# Patient Record
Sex: Male | Born: 1975 | Race: White | Hispanic: No | Marital: Married | Smoking: Never smoker
Health system: Southern US, Community
[De-identification: ages and names within clinical notes are randomized; demographics above are authoritative.]

## PROBLEM LIST (undated history)

## (undated) DIAGNOSIS — K219 Gastro-esophageal reflux disease without esophagitis: Secondary | ICD-10-CM

---

## 2011-08-03 ENCOUNTER — Emergency Department (INDEPENDENT_AMBULATORY_CARE_PROVIDER_SITE_OTHER)
Admission: EM | Admit: 2011-08-03 | Discharge: 2011-08-03 | Disposition: A | Discharge status: Home / Self Care | Payer: 59 | Source: Home / Self Care | Attending: Family Medicine | Admitting: Family Medicine

## 2011-08-03 DIAGNOSIS — S239XXA Sprain of unspecified parts of thorax, initial encounter: Secondary | ICD-10-CM

## 2011-08-03 DIAGNOSIS — S29012A Strain of muscle and tendon of back wall of thorax, initial encounter: Secondary | ICD-10-CM

## 2011-08-03 HISTORY — DX: Gastro-esophageal reflux disease without esophagitis: K21.9

## 2011-08-03 MED ORDER — CYCLOBENZAPRINE HCL 10 MG PO TABS: ORAL_TABLET | ORAL | Status: DC

## 2011-08-03 NOTE — ED Notes (Signed)
Right shoulder pain started yesterday w/out injury.

## 2011-08-03 NOTE — Discharge Instructions (Signed)
Continue Ibuprofen 800mg  every 8 hours with food.  Begin exercises as per instruction sheet.

## 2011-08-03 NOTE — ED Provider Notes (Signed)
History     CSN: [id number]  Arrival date & time 08/03/11  1541   First MD Initiated Contact with Patient 08/03/11 1634      Chief Complaint  Patient presents with  . Shoulder Pain     HPI Comments: Patient complains of awakening yesterday morning with pain in his right shoulder and upper back area.  He now has mild pain with movement of his right shoulder and shoulder blade, and pain with cough and deep inspiration although he does not have a cough.  He recalls no injury or recent change in physical activities.  The pain has improved after taking Ibuprofen 800mg   Patient is a 36 y.o. male presenting with shoulder pain. The history is provided by the patient.  Shoulder Pain This is a new problem. The current episode started yesterday. The problem occurs constantly. The problem has not changed since onset.Pertinent negatives include no chest pain, no abdominal pain, no headaches and no shortness of breath. The symptoms are aggravated by coughing (shoulder movement). The symptoms are relieved by NSAIDs. He has tried a warm compress for the symptoms. The treatment provided mild relief.    Past Medical History  Diagnosis Date  . GERD (gastroesophageal reflux disease)     History reviewed. No pertinent past surgical history.  Family History  Problem Relation Age of Onset  . Cancer Father   . Diabetes Father     History  Substance Use Topics  . Smoking status: Never Smoker   . Smokeless tobacco: Not on file  . Alcohol Use: No      Review of Systems  Respiratory: Negative for shortness of breath.   Cardiovascular: Negative for chest pain.  Gastrointestinal: Negative for abdominal pain.  Neurological: Negative for headaches.  All other systems reviewed and are negative.    Allergies  Amoxicillin; Excedrin extra strength; Naproxen; Penicillins; and Tylenol  Home Medications   Current Outpatient Rx  Name Route Sig Dispense Refill  . ESOMEPRAZOLE MAGNESIUM 20 MG PO CPDR  Oral Take 20 mg by mouth daily before breakfast.    . CYCLOBENZAPRINE HCL 10 MG PO TABS  Take one tab by mouth at bedtime as needed. 20 tablet 1    BP 138/94  Pulse 86  Temp(Src) 98.9 F (37.2 C) (Oral)  Resp 20  Ht 6\' 2"  (1.88 m)  Wt 251 lb 8 oz (114.08 kg)  BMI 32.29 kg/m2  SpO2 98%  Physical Exam  Nursing note and vitals reviewed. Constitutional: He is oriented to person, place, and time. He appears well-developed and well-nourished. No distress.  HENT:  Head: Normocephalic.  Eyes: Conjunctivae are normal. Pupils are equal, round, and reactive to light.  Neck: Normal range of motion. Neck supple.  Cardiovascular: Normal rate, regular rhythm and normal heart sounds.   Pulmonary/Chest: Effort normal and breath sounds normal. No respiratory distress. He has no wheezes. He has no rales. He exhibits no tenderness.  Abdominal: Soft. There is no tenderness.  Musculoskeletal:       Right shoulder: He exhibits normal range of motion, no tenderness, no bony tenderness, no swelling, no effusion, no crepitus, no deformity, no pain, no spasm, normal pulse and normal strength.       Arms:      Right shoulder has full range of motion. There is mild tenderness over the right trapezius muscle. There is distinct tenderness over medial and inferior edges of right scapula.  Pain elicited by resisted abduction of right shoulder while palpating right rhomboid muscles.  Distal Neurovascular function is intact.   Lymphadenopathy:    He has no cervical adenopathy.  Neurological: He is alert and oriented to person, place, and time.  Skin: Skin is warm and dry.    ED Course  Procedures none      1. Rhomboid muscle strain       MDM   Begin Flexeril 10mg  at bedtime. Continue Ibuprofen 800mg  every 8 hours with food.  Begin exercises as per instruction sheet ([facility] information and instruction handout given)  Followup with [facility] if not improving about two weeks.          Lattie Haw, MD 08/03/11 1714

## 2013-08-27 ENCOUNTER — Ambulatory Visit (INDEPENDENT_AMBULATORY_CARE_PROVIDER_SITE_OTHER): Payer: 59 | Admitting: Sports Medicine

## 2013-08-27 ENCOUNTER — Encounter: Payer: Self-pay | Admitting: Sports Medicine

## 2013-08-27 ENCOUNTER — Ambulatory Visit (INDEPENDENT_AMBULATORY_CARE_PROVIDER_SITE_OTHER): Payer: 59

## 2013-08-27 VITALS — BP 146/94 | HR 85 | Ht 74.0 in | Wt 255.0 lb

## 2013-08-27 DIAGNOSIS — M5412 Radiculopathy, cervical region: Secondary | ICD-10-CM | POA: Insufficient documentation

## 2013-08-27 DIAGNOSIS — M5380 Other specified dorsopathies, site unspecified: Secondary | ICD-10-CM

## 2013-08-27 MED ORDER — MELOXICAM 15 MG PO TABS: ORAL_TABLET | ORAL | Status: DC

## 2013-08-27 MED ORDER — PREDNISONE 50 MG PO TABS: ORAL_TABLET | ORAL | Status: DC

## 2013-08-27 NOTE — Progress Notes (Signed)
   Subjective:    I'm seeing this patient as a consultation for:  Dr. Cathren HarshBeese  CC: Left shoulder and neck pain  HPI: For the past 5 years this pleasant 38 year old male has had pain the localized from the neck radiating over the top of the shoulder, causing a tightness and burning type pain between the shoulder blades. Pain is moderate, persistent. It does seem better when he elevates his arms.  Past medical history, Surgical history, Family history not pertinant except as noted below, Social history, Allergies, and medications have been entered into the medical record, reviewed, and no changes needed.   Review of Systems: No headache, visual changes, nausea, vomiting, diarrhea, constipation, dizziness, abdominal pain, skin rash, fevers, chills, night sweats, weight loss, swollen lymph nodes, body aches, joint swelling, muscle aches, chest pain, shortness of breath, mood changes, visual or auditory hallucinations.   Objective:   General: Well Developed, well nourished, and in no acute distress.  Neuro/Psych: Alert and oriented x3, extra-ocular muscles intact, able to move all 4 extremities, sensation grossly intact. Skin: Warm and dry, no rashes noted.  Respiratory: Not using accessory muscles, speaking in full sentences, trachea midline.  Cardiovascular: Pulses palpable, no extremity edema. Abdomen: Does not appear distended. Neck: Inspection unremarkable. No palpable stepoffs. Negative Spurling's maneuver. Full neck range of motion Grip strength and sensation normal in bilateral hands Strength good C4 to T1 distribution No sensory change to C4 to T1 Negative Hoffman sign bilaterally Reflexes normal  Impression and Recommendations:   This case required medical decision making of moderate complexity.

## 2013-08-27 NOTE — Assessment & Plan Note (Signed)
With left-sided upper shoulder and dorsal scapular symptoms. X-rays, PT, prednisone, Mobic. Return to see me one month, MRI of the bladder.

## 2013-09-10 ENCOUNTER — Ambulatory Visit: Payer: 59 | Admitting: Physical Therapy

## 2013-09-13 ENCOUNTER — Encounter: Payer: 59 | Admitting: Physical Therapy

## 2013-09-15 ENCOUNTER — Encounter: Payer: 59 | Admitting: Physical Therapy

## 2013-09-15 ENCOUNTER — Ambulatory Visit (INDEPENDENT_AMBULATORY_CARE_PROVIDER_SITE_OTHER): Payer: 59 | Admitting: Physical Therapy

## 2013-09-15 DIAGNOSIS — M25519 Pain in unspecified shoulder: Secondary | ICD-10-CM

## 2013-09-15 DIAGNOSIS — M542 Cervicalgia: Secondary | ICD-10-CM

## 2013-09-15 DIAGNOSIS — M5412 Radiculopathy, cervical region: Secondary | ICD-10-CM

## 2013-09-15 DIAGNOSIS — M6281 Muscle weakness (generalized): Secondary | ICD-10-CM

## 2013-09-22 ENCOUNTER — Encounter: Payer: 59 | Admitting: Physical Therapy

## 2013-09-24 ENCOUNTER — Encounter: Payer: 59 | Admitting: Physical Therapy

## 2013-09-29 ENCOUNTER — Encounter (INDEPENDENT_AMBULATORY_CARE_PROVIDER_SITE_OTHER): Payer: 59 | Admitting: Physical Therapy

## 2013-09-29 DIAGNOSIS — M6281 Muscle weakness (generalized): Secondary | ICD-10-CM | POA: Diagnosis not present

## 2013-09-29 DIAGNOSIS — M5412 Radiculopathy, cervical region: Secondary | ICD-10-CM | POA: Diagnosis not present

## 2013-09-29 DIAGNOSIS — M25519 Pain in unspecified shoulder: Secondary | ICD-10-CM | POA: Diagnosis not present

## 2013-09-29 DIAGNOSIS — M542 Cervicalgia: Secondary | ICD-10-CM | POA: Diagnosis not present

## 2013-10-01 ENCOUNTER — Ambulatory Visit (INDEPENDENT_AMBULATORY_CARE_PROVIDER_SITE_OTHER): Payer: 59 | Admitting: Sports Medicine

## 2013-10-01 ENCOUNTER — Encounter: Payer: Self-pay | Admitting: Sports Medicine

## 2013-10-01 VITALS — BP 133/89 | HR 87 | Ht 74.0 in | Wt 253.0 lb

## 2013-10-01 DIAGNOSIS — M722 Plantar fascial fibromatosis: Secondary | ICD-10-CM

## 2013-10-01 DIAGNOSIS — M5412 Radiculopathy, cervical region: Secondary | ICD-10-CM

## 2013-10-01 NOTE — Progress Notes (Signed)
  Subjective:    CC: Followup  HPI: Right C5 cervical radiculitis: Improved but still present is by physical therapy, steroids, NSAIDs, muscle relaxers for 6 weeks.  Right foot pain: Localized at the heel, moderate, persistent, worse with the first few steps in the morning.  Past medical history, Surgical history, Family history not pertinant except as noted below, Social history, Allergies, and medications have been entered into the medical record, reviewed, and no changes needed.   Review of Systems: No fevers, chills, night sweats, weight loss, chest pain, or shortness of breath.   Objective:    General: Well Developed, well nourished, and in no acute distress.  Neuro: Alert and oriented x3, extra-ocular muscles intact, sensation grossly intact.  HEENT: Normocephalic, atraumatic, pupils equal round reactive to light, neck supple, no masses, no lymphadenopathy, thyroid nonpalpable.  Skin: Warm and dry, no rashes. Cardiac: Regular rate and rhythm, no murmurs rubs or gallops, no lower extremity edema.  Respiratory: Clear to auscultation bilaterally. Not using accessory muscles, speaking in full sentences. Right Foot: No visible erythema or swelling. Range of motion is full in all directions. Strength is 5/5 in all directions. No hallux valgus. No pes cavus or pes planus. No abnormal callus noted. No pain over the navicular prominence, or base of fifth metatarsal. Tender to palpation of the calcaneal insertion of plantar fascia. No pain at the Achilles insertion. No pain over the calcaneal bursa. No pain of the retrocalcaneal bursa. No tenderness to palpation over the tarsals, metatarsals, or phalanges. No hallux rigidus or limitus. No tenderness palpation over interphalangeal joints. No pain with compression of the metatarsal heads. Neurovascularly intact distally.  Impression and Recommendations:

## 2013-10-01 NOTE — Assessment & Plan Note (Signed)
We will start conservatively with home rehabilitation exercises and Air Heel.

## 2013-10-01 NOTE — Assessment & Plan Note (Signed)
Persistent symptoms although slightly improved with physical therapy and steroids. At this point we are going to proceed with an MRI for interventional injection planning. He does have left sided periscapular radicular symptoms suggestive of the C5 radiculitis. Return to see me for MRI results.

## 2013-10-05 ENCOUNTER — Telehealth: Payer: Self-pay | Admitting: *Deleted

## 2013-10-05 NOTE — Telephone Encounter (Signed)
Peer to peer is required for PA of MRI c-spine.

## 2013-10-05 NOTE — Telephone Encounter (Signed)
MRI cervical spine w/o approved as per Junious Dresseronnie @ [facility]. [phone] expires 11/19/13. [facility]. Corliss SkainsJamie Painter, CMA

## 2013-10-08 ENCOUNTER — Encounter: Payer: 59 | Admitting: Physical Therapy

## 2013-10-20 ENCOUNTER — Ambulatory Visit (INDEPENDENT_AMBULATORY_CARE_PROVIDER_SITE_OTHER): Payer: 59

## 2013-10-20 DIAGNOSIS — M5412 Radiculopathy, cervical region: Secondary | ICD-10-CM

## 2013-10-20 DIAGNOSIS — M47812 Spondylosis without myelopathy or radiculopathy, cervical region: Secondary | ICD-10-CM

## 2013-10-20 DIAGNOSIS — M5020 Other cervical disc displacement, unspecified cervical region: Secondary | ICD-10-CM

## 2013-11-05 ENCOUNTER — Encounter: Payer: Self-pay | Admitting: Sports Medicine

## 2013-11-05 ENCOUNTER — Ambulatory Visit (INDEPENDENT_AMBULATORY_CARE_PROVIDER_SITE_OTHER): Payer: 59 | Admitting: Sports Medicine

## 2013-11-05 VITALS — BP 150/96 | HR 94 | Ht 74.0 in | Wt 253.1 lb

## 2013-11-05 DIAGNOSIS — M722 Plantar fascial fibromatosis: Secondary | ICD-10-CM

## 2013-11-05 DIAGNOSIS — M5412 Radiculopathy, cervical region: Secondary | ICD-10-CM

## 2013-11-05 MED ORDER — AMITRIPTYLINE HCL 50 MG PO TABS: ORAL_TABLET | ORAL | Status: DC

## 2013-11-05 NOTE — Assessment & Plan Note (Signed)
Starting amitriptyline up to twice a day, return in a month, epidural injection if no better. MRI did show multilevel protrusions, worst at the C4-C5 level. He does have left-sided C5 radicular symptoms in a periscapular distribution.

## 2013-11-05 NOTE — Assessment & Plan Note (Signed)
Failed conservative measures, right plantar fascia injection as above.

## 2013-11-05 NOTE — Progress Notes (Signed)
  Subjective:    CC: Followup  HPI: Right plantar fasciitis: Improved slightly with AirHeel brace, but still has significant pain he localizes at the calcaneal insertion of the plantar fascia. Pain is severe, persistent.  Left cervical radiculitis: C5, periscapular, MRI results will be dictated below, this is improved but still present.  Past medical history, Surgical history, Family history not pertinant except as noted below, Social history, Allergies, and medications have been entered into the medical record, reviewed, and no changes needed.   Review of Systems: No fevers, chills, night sweats, weight loss, chest pain, or shortness of breath.   Objective:    General: Well Developed, well nourished, and in no acute distress.  Neuro: Alert and oriented x3, extra-ocular muscles intact, sensation grossly intact.  HEENT: Normocephalic, atraumatic, pupils equal round reactive to light, neck supple, no masses, no lymphadenopathy, thyroid nonpalpable.  Skin: Warm and dry, no rashes. Cardiac: Regular rate and rhythm, no murmurs rubs or gallops, no lower extremity edema.  Respiratory: Clear to auscultation bilaterally. Not using accessory muscles, speaking in full sentences.  Procedure: Real-time Ultrasound Guided Injection of right plantar fascia Device: GE Logiq E  Verbal informed consent obtained.  Time-out conducted.  Noted no overlying erythema, induration, or other signs of local infection.  Skin prepped in a sterile fashion.  Local anesthesia: Topical Ethyl chloride.  With sterile technique and under real time ultrasound guidance:  25-gauge needle advanced to the insertion of the plantar fascia into the calcaneus, 1 cc kenalog 40, 3 cc lidocaine injected easily. Completed without difficulty  Pain immediately resolved suggesting accurate placement of the medication.  Advised to call if fevers/chills, erythema, induration, drainage, or persistent bleeding.  Images permanently stored  and available for review in the ultrasound unit.  Impression: Technically successful ultrasound guided injection.  MRI of the cervical spine shows multilevel disc protrusions worse at C4-C5 level. 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  Impression and Recommendations:

## 2013-11-16 ENCOUNTER — Encounter: Payer: Self-pay | Admitting: Sports Medicine

## 2013-11-16 ENCOUNTER — Ambulatory Visit (INDEPENDENT_AMBULATORY_CARE_PROVIDER_SITE_OTHER): Payer: 59 | Admitting: Sports Medicine

## 2013-11-16 VITALS — BP 143/92 | HR 81 | Ht 74.0 in | Wt 251.0 lb

## 2013-11-16 DIAGNOSIS — M5412 Radiculopathy, cervical region: Secondary | ICD-10-CM

## 2013-11-16 DIAGNOSIS — M722 Plantar fascial fibromatosis: Secondary | ICD-10-CM

## 2013-11-16 NOTE — Assessment & Plan Note (Signed)
Has not yet started amitriptyline, I would like to see him back in 3 weeks, he did have disc intrusions worse at the C4-C5 level but also left-sided C5 radicular symptoms in the periscapular distribution. After a month if still no improvement we will proceed with an epidural.

## 2013-11-16 NOTE — Progress Notes (Signed)

## 2013-11-16 NOTE — Assessment & Plan Note (Signed)
Custom orthotics as above. Injection provided only a day of relief. Return in 3 weeks.

## 2013-12-10 ENCOUNTER — Encounter: Payer: Self-pay | Admitting: Sports Medicine

## 2013-12-10 ENCOUNTER — Ambulatory Visit (INDEPENDENT_AMBULATORY_CARE_PROVIDER_SITE_OTHER): Payer: 59 | Admitting: Sports Medicine

## 2013-12-10 VITALS — BP 158/96 | HR 89 | Ht 74.0 in | Wt 250.0 lb

## 2013-12-10 DIAGNOSIS — M722 Plantar fascial fibromatosis: Secondary | ICD-10-CM

## 2013-12-10 NOTE — Assessment & Plan Note (Signed)
Improved, he was pain-free until recently, now has only slight pain at the calcaneal insertion of the plantar fascia, and more pain at the posterior lateral calcaneus. Improved with the gel cups. Try gel cups plus orthotics for a month, if no better referral to orthopedic surgery.

## 2013-12-10 NOTE — Progress Notes (Signed)
  Subjective:    CC: Followup  HPI: Right plantar fasciitis: Improved and essentially was resolved with custom orthotics, did not respond to injection. Now having pain over the posterolateral heel. Mild, persistent.  Past medical history, Surgical history, Family history not pertinant except as noted below, Social history, Allergies, and medications have been entered into the medical record, reviewed, and no changes needed.   Review of Systems: No fevers, chills, night sweats, weight loss, chest pain, or shortness of breath.   Objective:    General: Well Developed, well nourished, and in no acute distress.  Neuro: Alert and oriented x3, extra-ocular muscles intact, sensation grossly intact.  HEENT: Normocephalic, atraumatic, pupils equal round reactive to light, neck supple, no masses, no lymphadenopathy, thyroid nonpalpable.  Skin: Warm and dry, no rashes. Cardiac: Regular rate and rhythm, no murmurs rubs or gallops, no lower extremity edema.  Respiratory: Clear to auscultation bilaterally. Not using accessory muscles, speaking in full sentences. Right Foot: No visible erythema or swelling. Range of motion is full in all directions. Strength is 5/5 in all directions. No hallux valgus. No pes cavus or pes planus. No abnormal callus noted. No pain over the navicular prominence, or base of fifth metatarsal. No tenderness to palpation of the calcaneal insertion of plantar fascia. No pain at the Achilles insertion. No pain over the calcaneal bursa. No pain of the retrocalcaneal bursa. Tender to palpation of the posterior lateral calcaneus No hallux rigidus or limitus. No tenderness palpation over interphalangeal joints. No pain with compression of the metatarsal heads. Neurovascularly intact distally.  Impression and Recommendations:

## 2014-01-14 ENCOUNTER — Ambulatory Visit: Payer: 59 | Admitting: Sports Medicine

## 2015-02-07 ENCOUNTER — Encounter: Payer: Self-pay | Admitting: *Deleted

## 2015-02-07 ENCOUNTER — Emergency Department (INDEPENDENT_AMBULATORY_CARE_PROVIDER_SITE_OTHER)
Admission: EM | Admit: 2015-02-07 | Discharge: 2015-02-07 | Disposition: A | Discharge status: Home / Self Care | Payer: 59 | Source: Home / Self Care | Attending: Emergency Medicine | Admitting: Emergency Medicine

## 2015-02-07 DIAGNOSIS — J039 Acute tonsillitis, unspecified: Secondary | ICD-10-CM | POA: Diagnosis not present

## 2015-02-07 DIAGNOSIS — R55 Syncope and collapse: Secondary | ICD-10-CM | POA: Diagnosis not present

## 2015-02-07 LAB — POCT CBC W AUTO DIFF (K'VILLE URGENT CARE)

## 2015-02-07 LAB — POCT INFLUENZA A/B
INFLUENZA A, POC: NEGATIVE
Influenza B, POC: NEGATIVE

## 2015-02-07 LAB — POCT MONO SCREEN (KUC): MONO, POC: NEGATIVE

## 2015-02-07 MED ORDER — ONDANSETRON 4 MG PO TBDP
4.0000 mg | ORAL_TABLET | Freq: Once | ORAL | Status: AC
  Administered 2015-02-07: 4 mg via ORAL

## 2015-02-07 MED ORDER — PROMETHAZINE HCL 25 MG PO TABS
25.0000 mg | ORAL_TABLET | Freq: Four times a day (QID) | ORAL | Status: DC | PRN
  Administered 2015-02-07: 25 mg via ORAL

## 2015-02-07 MED ORDER — AZITHROMYCIN 250 MG PO TABS: 250.0000 mg | ORAL_TABLET | Freq: Every day | ORAL | Status: AC

## 2015-02-07 MED ORDER — CEFTRIAXONE SODIUM 1 G IJ SOLR
1.0000 g | Freq: Once | INTRAMUSCULAR | Status: AC
  Administered 2015-02-07: 1 g via INTRAMUSCULAR

## 2015-02-07 MED ORDER — PROMETHAZINE HCL 25 MG PO TABS
25.0000 mg | ORAL_TABLET | Freq: Once | ORAL | Status: AC
  Administered 2015-02-07: 25 mg via ORAL

## 2015-02-07 NOTE — Discharge Instructions (Signed)

## 2015-02-07 NOTE — ED Notes (Signed)
No adverse reaction to Rocephin Injection after 30 minutes

## 2015-02-07 NOTE — ED Notes (Signed)
Pt reports rapid onset fever, aches, sweats, sore throat, HA x yesterday. Today 1 syncopal episode @ home when feeling as if he was going to vomit. Still c/o lightheadedness and dizziness. Pale in color. [facility], negative flu and strep tests.

## 2015-02-07 NOTE — ED Provider Notes (Signed)
CSN: [id number]     Arrival date & time 02/07/15  1038 History   First MD Initiated Contact with Patient 02/07/15 1114     Chief Complaint  Patient presents with  . Generalized Body Aches  . Fever  . Sore Throat  . Loss of Consciousness   (Consider location/radiation/quality/duration/timing/severity/associated sxs/prior Treatment) Patient is a 39 y.o. male presenting with fever, pharyngitis, and syncope. The history is provided by the patient. No language interpreter was used.  Fever Max temp prior to arrival:  102 Temp source:  Oral Severity:  Moderate Onset quality:  Gradual Duration:  2 days Timing:  Constant Progression:  Worsening Chronicity:  New Relieved by:  Nothing Worsened by:  Nothing tried Ineffective treatments:  Ibuprofen Associated symptoms: chills, congestion, cough, rhinorrhea and sore throat   Risk factors: sick contacts   Sore Throat  Loss of Consciousness Associated symptoms: fever   Pt seen at urgent care yesterday.  Pt had negative flu and negative strep.    Past Medical History  Diagnosis Date  . GERD (gastroesophageal reflux disease)    History reviewed. No pertinent past surgical history. Family History  Problem Relation Age of Onset  . Cancer Father   . Diabetes Father    Social History  Substance Use Topics  . Smoking status: Never Smoker   . Smokeless tobacco: None  . Alcohol Use: No    Review of Systems  Constitutional: Positive for fever and chills.  HENT: Positive for congestion, rhinorrhea and sore throat.   Respiratory: Positive for cough.   Cardiovascular: Positive for syncope.    Allergies  Amoxicillin; Aspirin-acetaminophen-caffeine; Naproxen; Penicillins; and Tylenol  Home Medications   Prior to Admission medications   Medication Sig Start Date End Date Taking? Authorizing Provider  pantoprazole (PROTONIX) 20 MG tablet Take 20 mg by mouth daily.   Yes Historical Provider, MD   Meds Ordered and Administered this  Visit   Medications  ondansetron (ZOFRAN-ODT) disintegrating tablet 4 mg (4 mg Oral Given 02/07/15 1141)    BP 137/74 mmHg  Pulse 98  Temp(Src) 99.1 F (37.3 C) (Oral)  Resp 16  Wt 250 lb (113.399 kg)  SpO2 96% No data found.   Physical Exam  Constitutional: He is oriented to person, place, and time. He appears well-developed and well-nourished.  HENT:  Head: Normocephalic.  Right Ear: External ear normal.  Left Ear: External ear normal.  Enlarged tonsils, exudate bilat,  Uvula midline, no edema. No sign of peritonsillar abscess  Eyes: EOM are normal.  Neck: Normal range of motion.  Pulmonary/Chest: Effort normal.  Abdominal: He exhibits no distension.  Musculoskeletal: Normal range of motion.  Neurological: He is alert and oriented to person, place, and time.  Psychiatric: He has a normal mood and affect.  Nursing note and vitals reviewed.   ED Course  Procedures (including critical care time)  Labs Review Labs Reviewed  POCT MONO SCREEN [facility])  POCT CBC W AUTO DIFF ([facility])  POCT INFLUENZA A/B    Imaging Review No results found.   Visual Acuity Review  Right Eye Distance:   Left Eye Distance:   Bilateral Distance:    Right Eye Near:   Left Eye Near:    Bilateral Near:         MDM mono is negative Cbc shows wbc's of 17   1. Acute tonsillitis, unspecified etiology    rovephin IM Zithromax rx Ibuprofen Pt advised to recheck here tomorrow.    Elson AreasLeslie K Sofia,  PA-C 02/07/15 1259  Elson Areas, PA-C 02/07/15 [address] [place], [place] 02/08/15 [phone]

## 2015-02-09 ENCOUNTER — Telehealth: Payer: Self-pay | Admitting: Emergency Medicine

## 2015-02-26 ENCOUNTER — Emergency Department (INDEPENDENT_AMBULATORY_CARE_PROVIDER_SITE_OTHER): Payer: 59

## 2015-02-26 ENCOUNTER — Emergency Department (INDEPENDENT_AMBULATORY_CARE_PROVIDER_SITE_OTHER)
Admission: EM | Admit: 2015-02-26 | Discharge: 2015-02-26 | Disposition: A | Discharge status: Home / Self Care | Payer: 59 | Source: Home / Self Care | Attending: Family Medicine | Admitting: Family Medicine

## 2015-02-26 ENCOUNTER — Encounter: Payer: Self-pay | Admitting: Emergency Medicine

## 2015-02-26 DIAGNOSIS — M533 Sacrococcygeal disorders, not elsewhere classified: Secondary | ICD-10-CM | POA: Diagnosis not present

## 2015-02-26 DIAGNOSIS — S300XXA Contusion of lower back and pelvis, initial encounter: Secondary | ICD-10-CM | POA: Diagnosis not present

## 2015-02-26 MED ORDER — MELOXICAM 15 MG PO TABS: 15.0000 mg | ORAL_TABLET | Freq: Every day | ORAL | Status: AC

## 2015-02-26 NOTE — Discharge Instructions (Signed)
Apply ice pack for 20 to 30 minutes, 3 to 4 times daily  Continue until pain decreases.  Recommend a "doughnut" pad for sitting.   Tailbone Injury The tailbone (coccyx) is the small bone at the lower end of the spine. A tailbone injury may involve stretched ligaments, bruising, or a broken bone (fracture). Tailbone injuries can be painful, and some may take a long time to heal. CAUSES This condition is often caused by falling and landing on the tailbone. Other causes include:  Repeated strain or friction from actions such as rowing and bicycling.  Childbirth. In some cases, the cause may not be known. RISK FACTORS This condition is more common in women than in men. SYMPTOMS Symptoms of this condition include:  Pain in the lower back, especially when sitting.  Pain or difficulty when standing up from a sitting position.  Bruising in the tailbone area.  Painful bowel movements.  In women, pain during intercourse. DIAGNOSIS This condition may be diagnosed based on your symptoms and a physical exam. X-rays may be taken if a fracture is suspected. You may also have other tests, such as a CT scan or MRI. TREATMENT This condition may be treated with medicines to help relieve your pain. Most tailbone injuries heal on their own in 4-6 weeks. However, recovery time may be longer if the injury involves a fracture. HOME CARE INSTRUCTIONS  Take medicines only as directed by your health care provider.  If directed, apply ice to the injured area:  Put ice in a plastic bag.  Place a towel between your skin and the bag.  Leave the ice on for 20 minutes, 2-3 times per day for the first 1-2 days.  Sit on a large, rubber or inflated ring or cushion to ease your pain. Lean forward when you are sitting to help decrease discomfort.  Avoid sitting for long periods of time.  Increase your activity as the pain allows. Perform any exercises that are recommended by your health care provider or  physical therapist.  If you have pain during bowel movements, use stool softeners as directed by your health care provider.  Eat a diet that includes plenty of fiber to help prevent constipation.  Keep all follow-up visits as directed by your health care provider. This is important. PREVENTION Wear appropriate padding and sports gear when bicycling and rowing. This can help to prevent developing an injury that is caused by repeated strain or friction. SEEK MEDICAL CARE IF:  Your pain becomes worse.  Your bowel movements cause a great deal of discomfort.  You are unable to have a bowel movement.  You have uncontrolled urine loss (urinary incontinence).  You have a fever.   This information is not intended to replace advice given to you by your health care provider. Make sure you discuss any questions you have with your health care provider.   Document Released: 03/29/2000 Document Revised: 08/16/2014 Document Reviewed: 03/28/2014 Elsevier Interactive Patient Education [employer]2016 [employer]

## 2015-02-26 NOTE — ED Notes (Signed)
Reports falling down 6 carpeted steps yesterday and landing hard on tailbone. Very painful to sit or bend.

## 2015-02-26 NOTE — ED Provider Notes (Signed)
CSN: [id number]     Arrival date & time 02/26/15  1100 History   First MD Initiated Contact with Patient 02/26/15 1213     Chief Complaint  Patient presents with  . Tailbone Pain      HPI Comments: Patient reports that he slid down 6 carpeted steps yesterday, landing hard on his tailbone.  He has had persistent pain with sitting.  The history is provided by the patient.    Past Medical History  Diagnosis Date  . GERD (gastroesophageal reflux disease)    History reviewed. No pertinent past surgical history. Family History  Problem Relation Age of Onset  . Cancer Father   . Diabetes Father    Social History  Substance Use Topics  . Smoking status: Never Smoker   . Smokeless tobacco: None  . Alcohol Use: No    Review of Systems  Constitutional: Negative.   HENT: Negative.   Eyes: Negative.   Respiratory: Negative.   Cardiovascular: Negative.   Genitourinary: Negative.   Musculoskeletal: Negative for back pain and gait problem.       Pain in tailbone.  No radiation.  No bowel or bladder dysfunction  Neurological: Negative for weakness.    Allergies  Amoxicillin; Aspirin-acetaminophen-caffeine; Naproxen; Penicillins; and Tylenol  Home Medications   Prior to Admission medications   Medication Sig Start Date End Date Taking? Authorizing Provider  azithromycin (ZITHROMAX) 250 MG tablet Take 1 tablet (250 mg total) by mouth daily. Take first 2 tablets together, then 1 every day until finished. 02/07/15   Elson Areas, PA-C  meloxicam (MOBIC) 15 MG tablet Take 1 tablet (15 mg total) by mouth daily. Take with food each morning 02/26/15   Lattie Haw, MD  pantoprazole (PROTONIX) 20 MG tablet Take 20 mg by mouth daily.    Historical Provider, MD   Meds Ordered and Administered this Visit  Medications - No data to display  BP 139/83 mmHg  Pulse 106  Temp(Src) 98.2 F (36.8 C) (Oral)  Ht  (1.88 m)  Wt 250 lb (113.399 kg)  BMI 32.08 kg/m2  SpO2 97% No data  found.   Physical Exam  Constitutional: He is oriented to person, place, and time. He appears well-developed and well-nourished. No distress.  HENT:  Head: Atraumatic.  Eyes: Pupils are equal, round, and reactive to light.  Neck: Normal range of motion.  Cardiovascular: Normal heart sounds.   Pulmonary/Chest: Breath sounds normal.  Abdominal: Bowel sounds are normal.  Musculoskeletal: He exhibits no edema.       Back:  Lower back is tender only over the coccyx.  No swelling or ecchymosis.  Neurological: He is alert and oriented to person, place, and time. He has normal reflexes.  Skin: Skin is warm and dry.  Nursing note and vitals reviewed.   ED Course  Procedures  None  Imaging Review Dg Sacrum/coccyx  02/26/2015  CLINICAL DATA:  Pt states he fell down about 5 steps last night and landed on his tailbone on each step while falling. C/o more pain on the left side. Denies numbness or tingling in lower extremities. EXAM: SACRUM AND COCCYX - 2+ VIEW COMPARISON:  None. FINDINGS: There is no evidence of fracture or other focal bone lesions. IMPRESSION: Negative. Electronically Signed   By: Amie Portland M.D.   On: 02/26/2015 11:29      MDM   1. Contusion of coccyx, initial encounter    Begin Mobic  daily (can tolerate NSAID if takes with  food). Apply ice pack for 20 to 30 minutes, 3 to 4 times daily  Continue until pain decreases.  Recommend a "doughnut" pad for sitting. Followup with Family Doctor if not improved in about two weeks    Lattie HawStephen A Beese, MD 03/04/15 1102

## 2015-08-01 ENCOUNTER — Ambulatory Visit (INDEPENDENT_AMBULATORY_CARE_PROVIDER_SITE_OTHER): Payer: 59 | Admitting: Sports Medicine

## 2015-08-01 DIAGNOSIS — M5412 Radiculopathy, cervical region: Secondary | ICD-10-CM

## 2015-08-01 MED ORDER — PREDNISONE 50 MG PO TABS: ORAL_TABLET | ORAL | Status: AC

## 2015-08-01 MED ORDER — TRAMADOL HCL 50 MG PO TABS: ORAL_TABLET | ORAL | Status: AC
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
## 2015-08-08 ENCOUNTER — Ambulatory Visit (INDEPENDENT_AMBULATORY_CARE_PROVIDER_SITE_OTHER): Payer: 59 | Admitting: Physical Therapy

## 2015-08-08 ENCOUNTER — Encounter: Payer: Self-pay | Admitting: Physical Therapy

## 2015-08-08 DIAGNOSIS — R252 Cramp and spasm: Secondary | ICD-10-CM

## 2015-08-08 DIAGNOSIS — M436 Torticollis: Secondary | ICD-10-CM | POA: Diagnosis not present

## 2015-08-08 DIAGNOSIS — M5412 Radiculopathy, cervical region: Secondary | ICD-10-CM | POA: Diagnosis not present

## 2015-08-08 NOTE — Patient Instructions (Signed)
Axial Extension (Chin Tuck)    Pull chin in and lengthen back of neck. Hold _2___ seconds while counting out loud. Repeat _5-10___ times. Do _multiple_ sessions per day.  Scapular Retraction (Standing)    With arms at sides, pinch shoulder blades together. Hold 5 sec.  Repeat _5-10___ times per set.  Do __several__ sessions per day.  Over Head Pull: Narrow Grip        On back, knees bent, feet flat, band across thighs, elbows straight but relaxed. Pull hands apart (start). Keeping elbows straight, bring arms up and over head, hands toward floor. Keep pull steady on band. Hold momentarily. Return slowly, keeping pull steady, back to start. Repeat _10__ times. Band color ___green___   Side Pull: Double Arm   On back, knees bent, feet flat. Arms perpendicular to body, shoulder level, elbows straight but relaxed. Pull arms out to sides, elbows straight. Resistance band comes across collarbones, hands toward floor. Hold momentarily. Slowly return to starting position. Repeat _10__ times. Band color ___green__   Sash   On back, knees bent, feet flat, left hand on left hip, right hand above left. Pull right arm DIAGONALLY (hip to shoulder) across chest. Bring right arm along head toward floor. Hold momentarily. Slowly return to starting position. Repeat _10__ times. Do with left arm. Band color __green____   Shoulder Rotation: Double Arm   On back, knees bent, feet flat, elbows tucked at sides, bent 90, hands palms up. Pull hands apart and down toward floor, keeping elbows near sides. Hold momentarily. Slowly return to starting position. Repeat _10__ times. Band color __green____
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
## 2015-08-24 ENCOUNTER — Encounter: Payer: 59 | Admitting: Physical Therapy

## 2015-08-31 ENCOUNTER — Ambulatory Visit: Payer: 59 | Admitting: Sports Medicine

## 2015-10-06 ENCOUNTER — Ambulatory Visit: Payer: 59 | Admitting: Sports Medicine

## 2016-01-11 ENCOUNTER — Ambulatory Visit: Payer: 59 | Admitting: Sports Medicine

## 2017-01-02 IMAGING — CR DG SACRUM/COCCYX 2+V
3 series · 3 of 3 positions shown · non-contrast
Comparison: None.

CLINICAL DATA: Pt states he fell down about 5 steps last night and
landed on his tailbone on each step while falling. C/o more pain on
the left side. Denies numbness or tingling in lower extremities.

EXAM:
SACRUM AND COCCYX - 2+ VIEW

[coccyx ap]
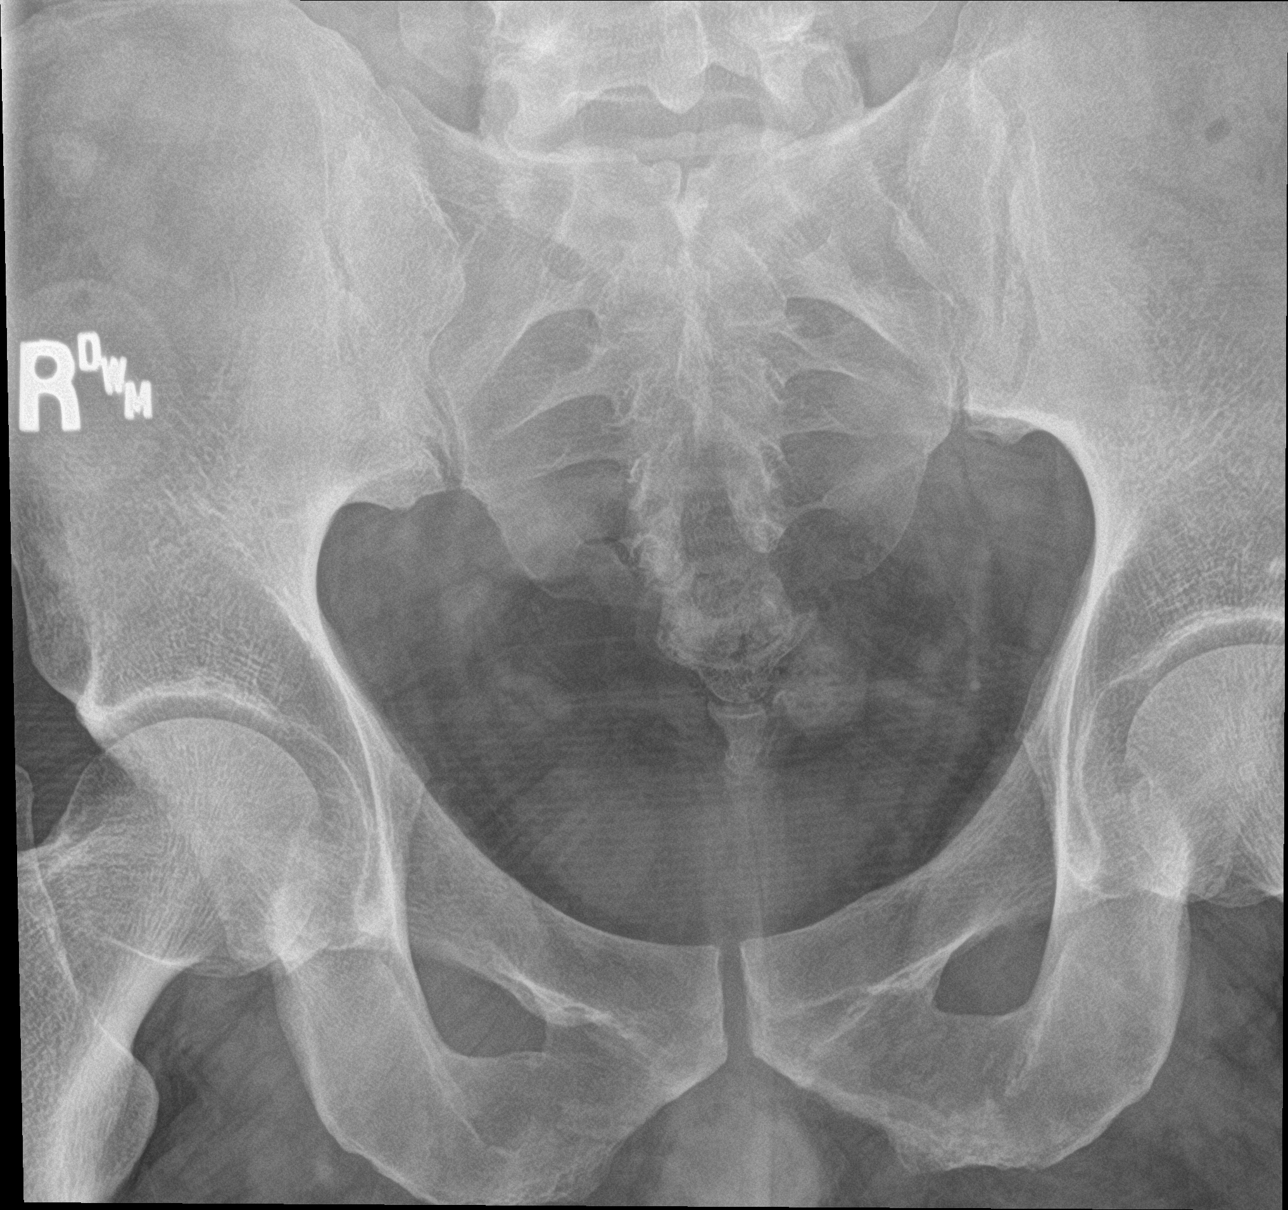

[sacrum ap]
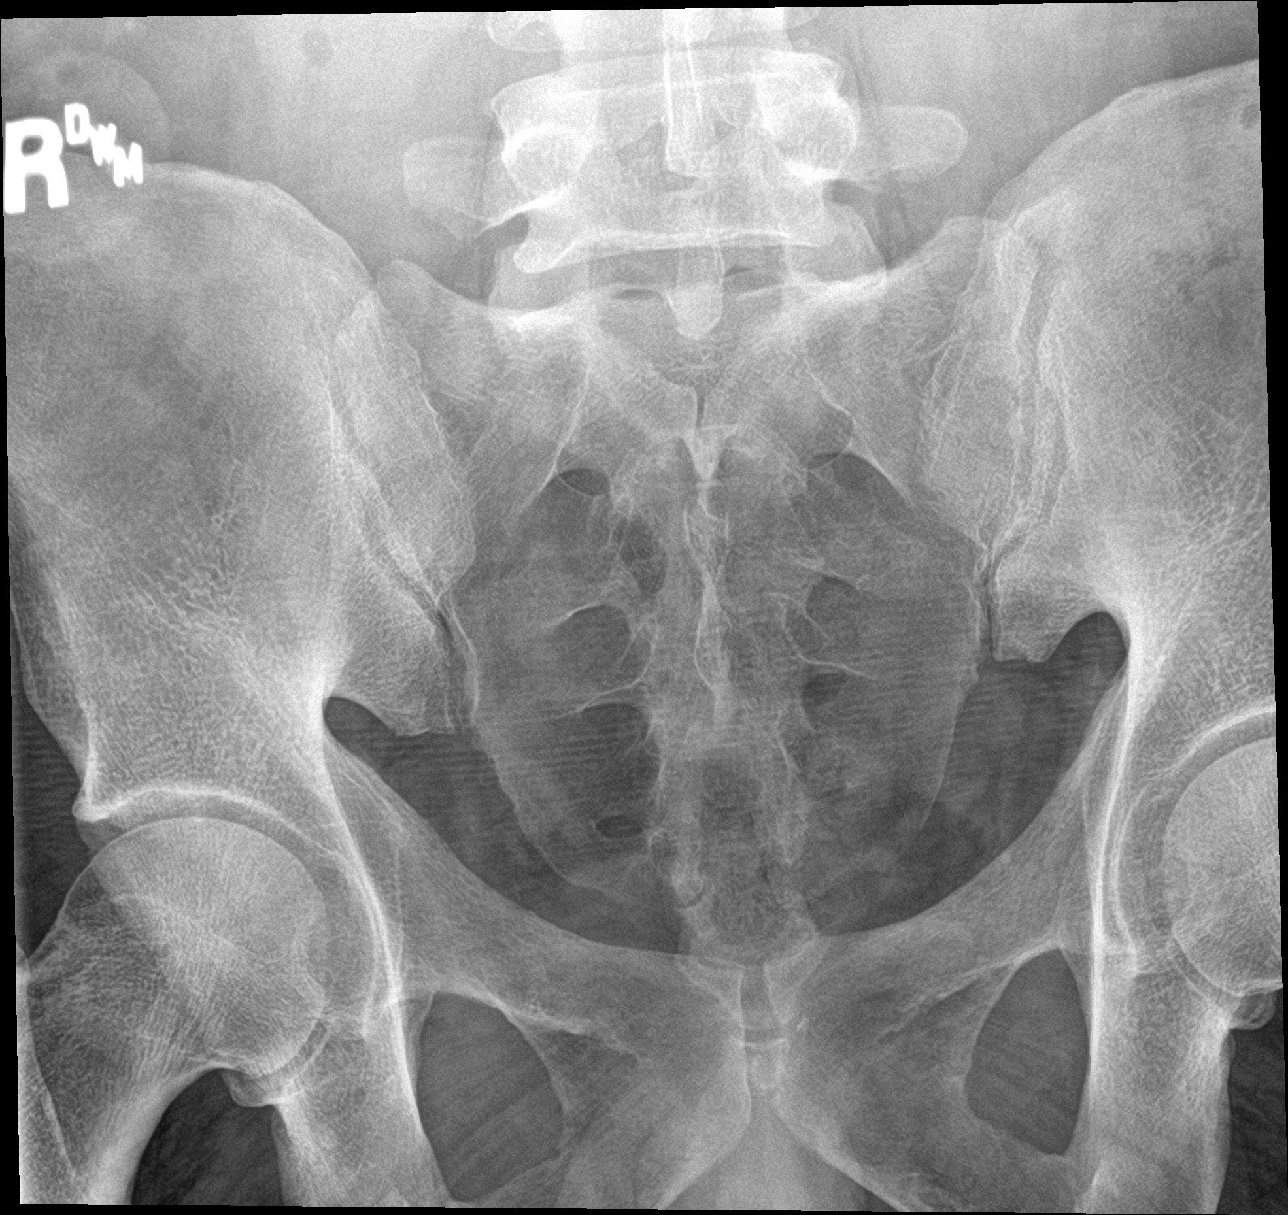

[sacrum lat]
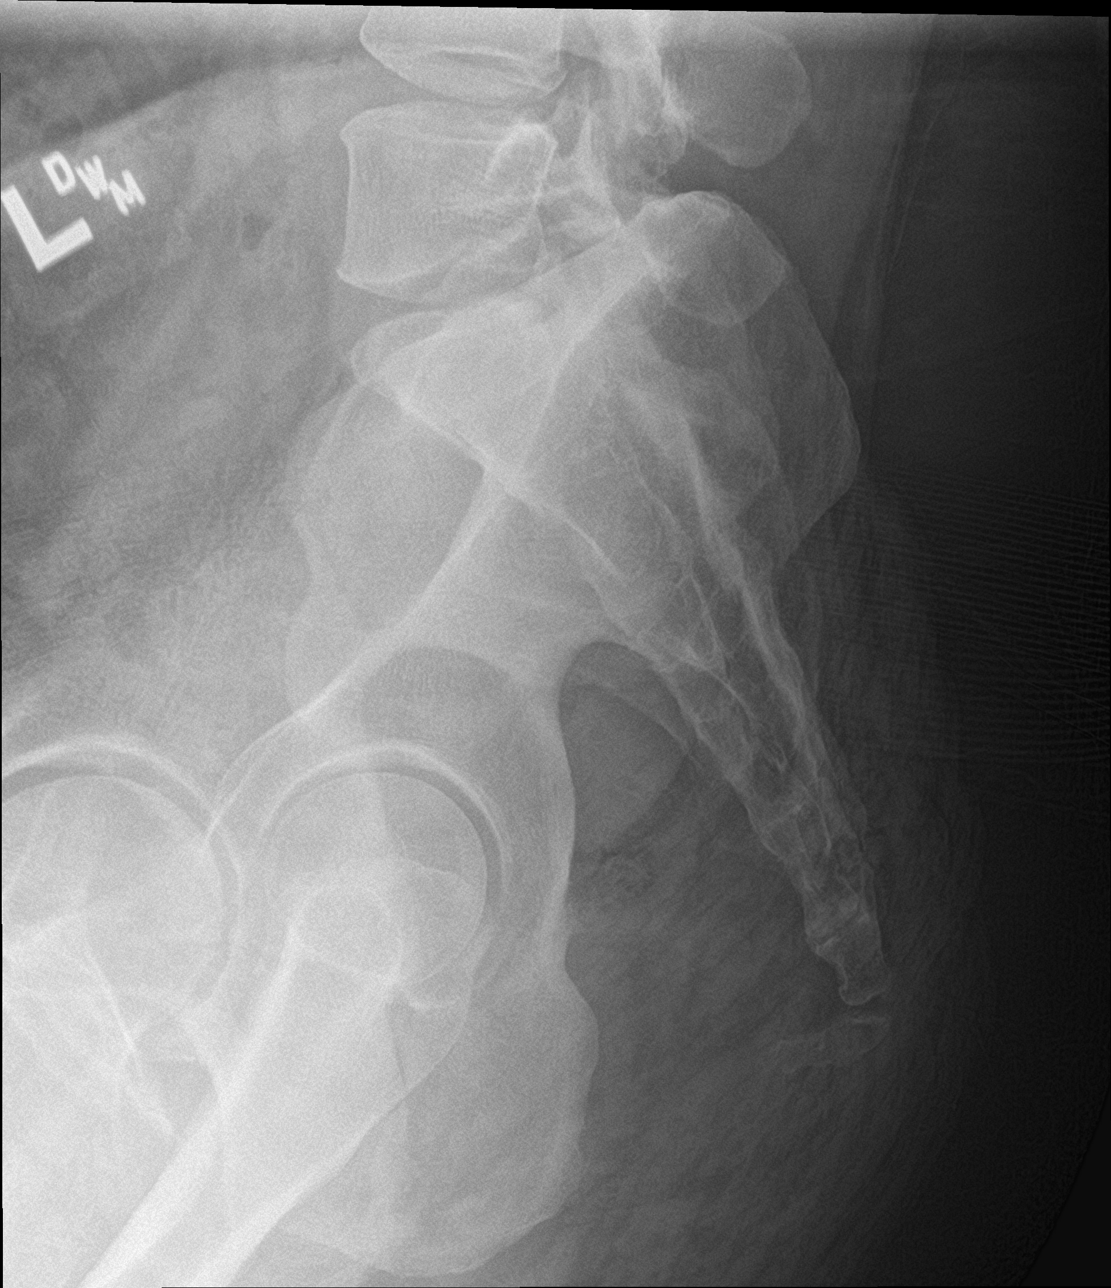

[3 of 3 positions shown; findings below may reference images not displayed]

FINDINGS: There is no evidence of fracture or other focal bone lesions.
IMPRESSION: Negative.

## 2019-03-16 ENCOUNTER — Encounter: Payer: 59 | Admitting: Sports Medicine

## 2022-03-09 ENCOUNTER — Ambulatory Visit: Admit: 2022-03-09 | Payer: 59

## 2023-02-24 ENCOUNTER — Encounter: Payer: 59 | Admitting: Sports Medicine

## 2023-03-06 ENCOUNTER — Encounter: Payer: 59 | Admitting: Sports Medicine
